# Patient Record
Sex: Female | Born: 1965 | Race: Black or African American | Hispanic: No | Marital: Single | Smoking: Never smoker
Health system: Southern US, Community
[De-identification: ages and names within clinical notes are randomized; demographics above are authoritative.]

## PROBLEM LIST (undated history)

## (undated) HISTORY — PX: OTHER SURGICAL HISTORY: SHX169

---

## 1997-12-14 ENCOUNTER — Encounter: Admission: RE | Admit: 1997-12-14 | Discharge: 1997-12-14 | Payer: Self-pay | Admitting: *Deleted

## 1998-05-30 ENCOUNTER — Ambulatory Visit (HOSPITAL_COMMUNITY): Admission: RE | Admit: 1998-05-30 | Discharge: 1998-05-30 | Payer: Self-pay | Admitting: *Deleted

## 2011-10-19 ENCOUNTER — Ambulatory Visit (INDEPENDENT_AMBULATORY_CARE_PROVIDER_SITE_OTHER): Payer: BC Managed Care – PPO | Admitting: Obstetrics and Gynecology

## 2011-10-19 ENCOUNTER — Encounter: Payer: Self-pay | Admitting: Obstetrics and Gynecology

## 2011-10-19 VITALS — BP 122/80 | Ht 70.0 in | Wt 217.0 lb

## 2011-10-19 DIAGNOSIS — N926 Irregular menstruation, unspecified: Secondary | ICD-10-CM

## 2011-10-19 DIAGNOSIS — Z124 Encounter for screening for malignant neoplasm of cervix: Secondary | ICD-10-CM

## 2011-10-19 LAB — POCT URINE PREGNANCY: Preg Test, Ur: NEGATIVE

## 2011-10-19 NOTE — Progress Notes (Signed)
Last Pap: at age 46  WNL: Yes Regular Periods:yes Contraception: n/a  Monthly Breast exam:yes Tetanus<66yrs:yes Nl.Bladder Function:yes Daily BMs:yes Healthy Diet:no Calcium:yes Mammogram:no Date of Mammogram: n/a Exercise:yes Have often Exercise: everyday Seatbelt: yes Abuse at home: no Stressful work:no Sigmoid-colonoscopy: n/a Bone Density: No PCP: Dr.Pange Change in PMH: no chgane Change in FMH:no change BP 122/80  Ht 5\' 10"  (1.778 m)  Wt 217 lb (98.431 kg)  BMI 31.14 kg/m2  LMP 09/10/2011 Pt without complaints Physical Examination: General appearance - alert, well appearing, and in no distress Mental status - normal mood, behavior, speech, dress, motor activity, and thought processes Neck - supple, no significant adenopathy, thyroid exam: thyroid is normal in size without nodules or tenderness Chest - clear to auscultation, no wheezes, rales or rhonchi, symmetric air entry Heart - normal rate and regular rhythm Abdomen - soft, nontender, nondistended, no masses or organomegaly Breasts - breasts appear normal, no suspicious masses, no skin or nipple changes or axillary nodes Pelvic - normal external genitalia, vulva, vagina, cervix, uterus and adnexa Rectal - normal rectal, no masses, rectal exam not indicated Back exam - full range of motion, no tenderness, palpable spasm or pain on motion Neurological - alert, oriented, normal speech, no focal findings or movement disorder noted Musculoskeletal - no joint tenderness, deformity or swelling Extremities - no edema, redness or tenderness in the calves or thighs Skin - normal coloration and turgor, no rashes, no suspicious skin lesions noted Routine exam Pap sent yes Mammogram due yes and scheduled pt is abstinant and declined contraception.   RT 1 yr

## 2011-10-22 LAB — PAP IG W/ RFLX HPV ASCU

## 2011-11-09 ENCOUNTER — Telehealth: Payer: Self-pay

## 2011-11-09 NOTE — Telephone Encounter (Signed)
spokew ith pt rgd msg advised pt of pap results pt voice understanding

## 2013-01-02 ENCOUNTER — Other Ambulatory Visit: Payer: Self-pay | Admitting: Obstetrics and Gynecology

## 2013-01-02 DIAGNOSIS — Z1231 Encounter for screening mammogram for malignant neoplasm of breast: Secondary | ICD-10-CM

## 2013-02-02 ENCOUNTER — Ambulatory Visit: Payer: Self-pay

## 2013-02-02 ENCOUNTER — Ambulatory Visit
Admission: RE | Admit: 2013-02-02 | Discharge: 2013-02-02 | Disposition: A | Discharge status: Home / Self Care | Payer: BC Managed Care – PPO | Source: Ambulatory Visit | Attending: Obstetrics and Gynecology | Admitting: Obstetrics and Gynecology

## 2013-02-02 DIAGNOSIS — Z1231 Encounter for screening mammogram for malignant neoplasm of breast: Secondary | ICD-10-CM

## 2014-03-05 HISTORY — PX: FOOT SURGERY: SHX648

## 2014-07-07 ENCOUNTER — Ambulatory Visit (INDEPENDENT_AMBULATORY_CARE_PROVIDER_SITE_OTHER): Payer: BC Managed Care – PPO | Admitting: Podiatry

## 2014-07-07 ENCOUNTER — Ambulatory Visit (INDEPENDENT_AMBULATORY_CARE_PROVIDER_SITE_OTHER): Payer: BC Managed Care – PPO

## 2014-07-07 VITALS — BP 113/73 | HR 60 | Resp 15

## 2014-07-07 DIAGNOSIS — M2012 Hallux valgus (acquired), left foot: Secondary | ICD-10-CM

## 2014-07-07 DIAGNOSIS — L84 Corns and callosities: Secondary | ICD-10-CM

## 2014-07-07 DIAGNOSIS — M21612 Bunion of left foot: Secondary | ICD-10-CM

## 2014-07-07 DIAGNOSIS — M2042 Other hammer toe(s) (acquired), left foot: Secondary | ICD-10-CM

## 2014-07-07 DIAGNOSIS — M779 Enthesopathy, unspecified: Secondary | ICD-10-CM

## 2014-07-07 NOTE — Progress Notes (Signed)
   Subjective:    Patient ID: Tina [name], female    DOB: 06/02/1965, 49 y.o.   MRN: [id number]  HPI  Pt presents with left toe 2nd met hammertoe deformity, aches and causes pain when wearing enclosed shoes  Review of Systems  All other systems reviewed and are negative.      Objective:   Physical Exam        Assessment & Plan:

## 2014-07-07 NOTE — Progress Notes (Signed)
Subjective:     Patient ID: Tina [name], female   DOB: 04/22/1965, 49 y.o.   MRN: [id number]  HPI patient presents stating I'm getting pain in my second toe left underneath my second metatarsal left my fifth toe left and my bunion is bothering me when I wear shoe gear left. States that she has significant family history with her grandmother having this and it's gotten worse over the last year   Review of Systems  All other systems reviewed and are negative.      Objective:   Physical Exam  Constitutional: She is oriented to person, place, and time.  Cardiovascular: Intact distal pulses.   Musculoskeletal: Normal range of motion.  Neurological: She is oriented to person, place, and time.  Skin: Skin is warm.  Nursing note and vitals reviewed.  neurovascular status intact with muscle strength adequate range of motion subtalar midtarsal joint within normal limits. Patient's noted to have structural bunion deformity with deviation of the left toe against the second toe with keratotic lesion between the hallux and second toe left and quite a bit of keratotic lesion subsecond metatarsal left along with fifth toe left with deviation and rotation of the toe noted. 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 At this time we discussed conservative and surgical options that could be considered for this particular problem that she is experiencing and we reviewed all of these options. She would like to have her foot fixed due to the long-standing  nature and the fact that her mother and grandmother have had issues with her feet and she would like to have some done before becomes worse for her I've recommended Austin-type osteotomy along with possible Akin osteotomy digital fusion with shortening digit 2 left arthroplasty digits 5 left. Explain this to her at great length and she is scheduled for August for procedure and will be seen back for consult. Today I debrided all lesions and I also scanned for custom orthotics to try to take some of the plantar pressure off her feet

## 2014-07-27 ENCOUNTER — Ambulatory Visit: Payer: BC Managed Care – PPO | Admitting: *Deleted

## 2014-07-27 DIAGNOSIS — M779 Enthesopathy, unspecified: Secondary | ICD-10-CM

## 2014-07-27 NOTE — Patient Instructions (Signed)

## 2014-07-27 NOTE — Progress Notes (Signed)
Patient ID: Tina [name], female   DOB: Apr 10, 1965, 49 y.o.   MRN: [id number] PICKING UP INSERTS

## 2014-10-06 ENCOUNTER — Telehealth: Payer: Self-pay | Admitting: *Deleted

## 2014-10-06 ENCOUNTER — Ambulatory Visit (INDEPENDENT_AMBULATORY_CARE_PROVIDER_SITE_OTHER): Payer: BC Managed Care – PPO | Admitting: Podiatry

## 2014-10-06 ENCOUNTER — Encounter: Payer: Self-pay | Admitting: Podiatry

## 2014-10-06 DIAGNOSIS — M2012 Hallux valgus (acquired), left foot: Secondary | ICD-10-CM | POA: Diagnosis not present

## 2014-10-06 DIAGNOSIS — Q667 Congenital pes cavus: Secondary | ICD-10-CM | POA: Diagnosis not present

## 2014-10-06 DIAGNOSIS — M779 Enthesopathy, unspecified: Secondary | ICD-10-CM

## 2014-10-06 DIAGNOSIS — M2042 Other hammer toe(s) (acquired), left foot: Secondary | ICD-10-CM

## 2014-10-06 DIAGNOSIS — M21612 Bunion of left foot: Secondary | ICD-10-CM

## 2014-10-06 DIAGNOSIS — M216X9 Other acquired deformities of unspecified foot: Secondary | ICD-10-CM

## 2014-10-06 NOTE — Telephone Encounter (Signed)
I called the surgical center per Dr. Charlsie Merles and added a possible Metatarsal Osteotomy 2nd left to surgery scheduled for 11/02/2014.

## 2014-10-06 NOTE — Progress Notes (Signed)
Subjective:     Patient ID: Tina [name], female   DOB: 1965/10/05, 49 y.o.   MRN: [id number]  HPI patient presents stating I have a structural bunion deformity left and elevated second toe that hurts me the bone underneath hurts and my fifth toe. She has tried trimming she's tried padding she's tried wider shoes without relief of symptoms and has had this problem getting worse for the last several years   Review of Systems     Objective:   Physical Exam Neurovascular status intact muscle strength adequate range of motion within normal limits with structural bunion deformity left with redness and pain on the first metatarsal and deviation of the big toe against the second toe left with rigid elevation of the second toe left with pain upon the top of the toe and plantarflexed second metatarsal left with keratotic lesion and keratotic lesion fifth toe left foot that's very painful when pressed area x-rays reveal elevation of the intermetatarsal angle of approximately 15 with rigid control and rigid nature of the second toe left foot and plantarflexed second metatarsal and rotated fifth toe    Assessment:     HAV deformity left with hallux interphalangeus deformity and structural hammertoe deformity second left with plantarflexed metatarsal second left hammertoe deformity fifth left noted    Plan:     H&P and all conditions reviewed with patient. At this point due to long-standing nature I did recommend structural correction and I recommended Austin-type osteotomy left possible Akin osteotomy left to straighten the big toe along with digital fusion digit 2 left and possible elevation of the second metatarsal left and on how it feels after the toe is stabilized. Also arthroplasty fifth toe left. At this time allowed patient to read consent form reviewing all procedures and going over alternative treatments and complications. Patient reads through this and we discussed and she signed consent form  reviewing procedures and is scheduled for her outpatient surgery. She is given her boot for postoperative usage and we'll get used to it prior to surgery and will be seen back and is encouraged to call she should have any questions prior to procedure

## 2014-10-06 NOTE — Patient Instructions (Signed)
Pre-Operative Instructions  Congratulations, you have decided to take an important step to improving your quality of life.  You can be assured that the doctors of [facility] will be with you every step of the way.  1. Plan to be at the surgery center/hospital at least 1 (one) hour prior to your scheduled time unless otherwise directed by the surgical center/hospital staff.  You must have a responsible adult accompany you, remain during the surgery and drive you home.  Make sure you have directions to the surgical center/hospital and know how to get there on time. 2. For hospital based surgery you will need to obtain a history and physical form from your family physician within 1 month prior to the date of surgery- we will give you a form for you primary physician.  3. We make every effort to accommodate the date you request for surgery.  There are however, times where surgery dates or times have to be moved.  We will contact you as soon as possible if a change in schedule is required.   4. No Aspirin/Ibuprofen for one week before surgery.  If you are on aspirin, any non-steroidal anti-inflammatory medications (Mobic, Aleve, Ibuprofen) you should stop taking it 7 days prior to your surgery.  You make take Tylenol  For pain prior to surgery.  5. Medications- If you are taking daily heart and blood pressure medications, seizure, reflux, allergy, asthma, anxiety, pain or diabetes medications, make sure the surgery center/hospital is aware before the day of surgery so they may notify you which medications to take or avoid the day of surgery. 6. No food or drink after midnight the night before surgery unless directed otherwise by surgical center/hospital staff. 7. No alcoholic beverages 24 hours prior to surgery.  No smoking 24 hours prior to or 24 hours after surgery. 8. Wear loose pants or shorts- loose enough to fit over bandages, boots, and casts. 9. No slip on shoes, sneakers are best. 10. Bring  your boot with you to the surgery center/hospital.  Also bring crutches or a walker if your physician has prescribed it for you.  If you do not have this equipment, it will be provided for you after surgery. 11. If you have not been contracted by the surgery center/hospital by the day before your surgery, call to confirm the date and time of your surgery. 12. Leave-time from work may vary depending on the type of surgery you have.  Appropriate arrangements should be made prior to surgery with your employer. 13. Prescriptions will be provided immediately following surgery by your doctor.  Have these filled as soon as possible after surgery and take the medication as directed. 14. Remove nail polish on the operative foot. 15. Wash the night before surgery.  The night before surgery wash the foot and leg well with the antibacterial soap provided and water paying special attention to beneath the toenails and in between the toes.  Rinse thoroughly with water and dry well with a towel.  Perform this wash unless told not to do so by your physician.  Enclosed: 1 Ice pack (please put in freezer the night before surgery)   1 Hibiclens skin cleaner   Pre-op Instructions  If you have any questions regarding the instructions, do not hesitate to call our office.  [place]: [address] , [place] [address] [phone]  [place]: [address] [place], [place] [address] [phone]  [place]: [address]  [place], [place] [address] [phone]  Dr. Richard   Tuchman DPM, Dr. Norman Regal DPM Dr. Richard Sikora DPM, Dr. M. Todd Hyatt DPM, Dr. Kathryn Egerton DPM 

## 2014-10-11 DIAGNOSIS — M79673 Pain in unspecified foot: Secondary | ICD-10-CM

## 2014-11-02 DIAGNOSIS — M21542 Acquired clubfoot, left foot: Secondary | ICD-10-CM | POA: Diagnosis not present

## 2014-11-02 DIAGNOSIS — M2042 Other hammer toe(s) (acquired), left foot: Secondary | ICD-10-CM | POA: Diagnosis not present

## 2014-11-02 DIAGNOSIS — M2012 Hallux valgus (acquired), left foot: Secondary | ICD-10-CM | POA: Diagnosis not present

## 2014-11-10 ENCOUNTER — Encounter: Payer: Self-pay | Admitting: *Deleted

## 2014-11-10 NOTE — Progress Notes (Signed)
Surgery performed on 11/02/2014 at [facility]; Aiken Osteotomy, Austin Bunionectomy, Metatarsal Osteotomy 2nd left and Hammer Toe Repair 2nd digit with pin and 5th digit left foot.  Prescription written for Percocet 10/325 mg.

## 2014-11-11 ENCOUNTER — Ambulatory Visit (INDEPENDENT_AMBULATORY_CARE_PROVIDER_SITE_OTHER): Payer: BC Managed Care – PPO | Admitting: Podiatry

## 2014-11-11 ENCOUNTER — Ambulatory Visit (INDEPENDENT_AMBULATORY_CARE_PROVIDER_SITE_OTHER): Payer: BC Managed Care – PPO

## 2014-11-11 ENCOUNTER — Encounter: Payer: Self-pay | Admitting: Podiatry

## 2014-11-11 VITALS — BP 110/75 | HR 65 | Temp 98.2°F | Resp 16

## 2014-11-11 DIAGNOSIS — Z9889 Other specified postprocedural states: Secondary | ICD-10-CM

## 2014-11-11 DIAGNOSIS — M2012 Hallux valgus (acquired), left foot: Secondary | ICD-10-CM

## 2014-11-11 DIAGNOSIS — M2042 Other hammer toe(s) (acquired), left foot: Secondary | ICD-10-CM

## 2014-11-11 DIAGNOSIS — M21612 Bunion of left foot: Secondary | ICD-10-CM

## 2014-11-11 NOTE — Progress Notes (Signed)
Subjective:     Patient ID: Tina Tina [name], female   DOB: 25-Aug-1965, 49 y.o.   MRN: [id number]  HPI patient presents stating she's doing really well with mild discomfort and swelling if she's on her foot to much but she's not having significant pain   Review of Systems     Objective:   Physical Exam Neurovascular status intact negative Homans sign noted with patient having well coapted incision sites left first metatarsal left hallux second digit with pin intact second toe and wound edges well coapted fifth toe. Range of motion of the first MPJ is adequate and structural alignment looks good with mild elevation of the second toe    Assessment:     Doing well Tina [name] forefoot reconstruction left    Plan:     H&P and x-rays of this foot reviewed. I reapplied sterile dressing lowering the second toe and advised on continued elevation compression and reappoint 2 weeks for suture removal or earlier if any issues should occur

## 2014-11-12 ENCOUNTER — Encounter: Payer: Self-pay | Admitting: Podiatry

## 2014-11-22 DIAGNOSIS — M2012 Hallux valgus (acquired), left foot: Secondary | ICD-10-CM

## 2014-11-26 ENCOUNTER — Encounter: Payer: Self-pay | Admitting: Podiatry

## 2014-11-26 ENCOUNTER — Ambulatory Visit (INDEPENDENT_AMBULATORY_CARE_PROVIDER_SITE_OTHER): Payer: BC Managed Care – PPO | Admitting: Podiatry

## 2014-11-26 ENCOUNTER — Telehealth: Payer: Self-pay | Admitting: *Deleted

## 2014-11-26 ENCOUNTER — Ambulatory Visit (INDEPENDENT_AMBULATORY_CARE_PROVIDER_SITE_OTHER): Payer: BC Managed Care – PPO

## 2014-11-26 DIAGNOSIS — Z9889 Other specified postprocedural states: Secondary | ICD-10-CM

## 2014-11-26 DIAGNOSIS — M2012 Hallux valgus (acquired), left foot: Secondary | ICD-10-CM | POA: Diagnosis not present

## 2014-11-26 DIAGNOSIS — M21612 Bunion of left foot: Secondary | ICD-10-CM

## 2014-11-26 DIAGNOSIS — M2042 Other hammer toe(s) (acquired), left foot: Secondary | ICD-10-CM

## 2014-11-26 NOTE — Telephone Encounter (Signed)
"  I'm calling in regards to her disability.  I want to confirm that she had surgery on 11/02/2014."  Yes, patient had surgery on 11/02/2014.  "Okay, that's all I needed."

## 2014-11-28 NOTE — Progress Notes (Signed)
Subjective:     Patient ID: Tina [name], female   DOB: [birth year], 49 y.o.   MRN: [id number]  HPI patient states I'm doing pretty well but still having some discomfort and swelling when I'm on it too much and the second toe is slightly up over where it was   Review of Systems     Objective:   Physical Exam Neurovascular status intact muscle strength adequate range of motion within normal limits with patient noted to have good incision sites that are well coapted with stitches intact of the lesser digits and patient's noted to have mild elevation of the second digit with pin in place    Assessment:     Recovering well from foot surgery left with good alignment noted and pin in place second toe with mild elevation of the second toe and all wound edges healing well    Plan:     Stitches are removed and instructed on range of motion for the first MPJ and dispensed a above ankle brace to control swelling and also to lower the second toe. With a digital splint which was applied. Patient will be seen back to recheck

## 2014-12-10 ENCOUNTER — Encounter: Payer: Self-pay | Admitting: Podiatry

## 2014-12-10 ENCOUNTER — Ambulatory Visit (INDEPENDENT_AMBULATORY_CARE_PROVIDER_SITE_OTHER): Payer: BC Managed Care – PPO | Admitting: Podiatry

## 2014-12-10 ENCOUNTER — Ambulatory Visit (INDEPENDENT_AMBULATORY_CARE_PROVIDER_SITE_OTHER): Payer: BC Managed Care – PPO

## 2014-12-10 VITALS — BP 129/82 | HR 64 | Resp 16

## 2014-12-10 DIAGNOSIS — Z9889 Other specified postprocedural states: Secondary | ICD-10-CM

## 2014-12-10 DIAGNOSIS — M21612 Bunion of left foot: Secondary | ICD-10-CM

## 2014-12-10 DIAGNOSIS — R600 Localized edema: Secondary | ICD-10-CM

## 2014-12-10 DIAGNOSIS — M2042 Other hammer toe(s) (acquired), left foot: Secondary | ICD-10-CM

## 2014-12-11 NOTE — Progress Notes (Signed)
Subjective:     Patient ID: Tina [name], female   DOB: 02/04/1966, 49 y.o.   MRN: [id number]  HPI patient states I'm doing pretty well with my digit in good alignment and wound edges well coapted. I still have mild swelling   Review of Systems     Objective:   Physical Exam Neurovascular status intact muscle strength adequate range of motion within normal limits with digit in good alignment pin in place structural bunion correction good in other digits doing well with mild to moderate forefoot edema noted    Assessment:     Moderate increase in edema over what I would expect but good structural alignment noted and negative Homans sign    Plan:     H&P and x-rays reviewed with patient. Pin removed second toe and sterile dressing applied and at this time I applied Unna boot Ace wrap to the left foot and advised on trying to leave it on for 4 days and take it off earlier if any digital discoloration or pain were to occur. Patient will be rechecked again in 3 weeks we'll continue with elevation compression until that time

## 2014-12-21 ENCOUNTER — Telehealth: Payer: Self-pay | Admitting: *Deleted

## 2014-12-30 NOTE — Telephone Encounter (Signed)
Entered in error

## 2014-12-31 ENCOUNTER — Other Ambulatory Visit: Payer: BC Managed Care – PPO

## 2014-12-31 ENCOUNTER — Ambulatory Visit (INDEPENDENT_AMBULATORY_CARE_PROVIDER_SITE_OTHER): Payer: BC Managed Care – PPO | Admitting: Podiatry

## 2014-12-31 ENCOUNTER — Ambulatory Visit (INDEPENDENT_AMBULATORY_CARE_PROVIDER_SITE_OTHER): Payer: BC Managed Care – PPO

## 2014-12-31 DIAGNOSIS — M21612 Bunion of left foot: Secondary | ICD-10-CM

## 2014-12-31 DIAGNOSIS — Z9889 Other specified postprocedural states: Secondary | ICD-10-CM | POA: Diagnosis not present

## 2014-12-31 DIAGNOSIS — M2042 Other hammer toe(s) (acquired), left foot: Secondary | ICD-10-CM

## 2015-01-02 NOTE — Progress Notes (Signed)
Subjective:     Patient ID: Tina [name], female   DOB: May 28, 1965, 49 y.o.   MRN: [id number]  HPI patient states I'm doing pretty good with my foot with still some swelling noted and I can walk but I do get pain if I walk too much   Review of Systems     Objective:   Physical Exam Neurovascular status unchanged with patient having good digital function first MPJ and good digital position second and third toes with mild elevation of the second toe with wound edges well coapted and excellent range of motion    Assessment:     Doing well from structural foot correction left    Plan:     Reviewed condition and advised on continued lowering of the second toe and explained how to do it continued compression elevation and reviewed x-rays. Reappoint to recheck an approximate 4 weeks or earlier if any issues should occur

## 2015-01-03 ENCOUNTER — Telehealth: Payer: Self-pay | Admitting: *Deleted

## 2015-01-03 NOTE — Telephone Encounter (Addendum)
Pt left name, DOB and phone number only.  Left message informing pt I would call again 01/04/2015.  Pt called 551pm on 01/03/2015, states she is returning my call.  Left message informing pt if she was having severe pain with her foot, it would benefit her and her busy class schedule to make an appt.  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  Dr. Charlsie Merlesegal states pt may be out of work until reevaluated 02/11/2015. I told pt I would have the note ready for pick up, but if she needed paperwork filled out for disability she would need to bring that for Algis GreenhouseJanet Albert to complete. Pt states she will pick up the note 01/05/2015.

## 2015-01-04 ENCOUNTER — Encounter: Payer: Self-pay | Admitting: *Deleted

## 2015-02-11 ENCOUNTER — Ambulatory Visit (INDEPENDENT_AMBULATORY_CARE_PROVIDER_SITE_OTHER): Payer: BC Managed Care – PPO | Admitting: Podiatry

## 2015-02-11 ENCOUNTER — Ambulatory Visit (INDEPENDENT_AMBULATORY_CARE_PROVIDER_SITE_OTHER): Payer: BC Managed Care – PPO

## 2015-02-11 ENCOUNTER — Encounter: Payer: Self-pay | Admitting: Podiatry

## 2015-02-11 VITALS — BP 129/87 | HR 70 | Resp 16

## 2015-02-11 DIAGNOSIS — M2042 Other hammer toe(s) (acquired), left foot: Secondary | ICD-10-CM

## 2015-02-11 DIAGNOSIS — Z9889 Other specified postprocedural states: Secondary | ICD-10-CM | POA: Diagnosis not present

## 2015-02-11 DIAGNOSIS — M21612 Bunion of left foot: Secondary | ICD-10-CM

## 2015-02-13 NOTE — Progress Notes (Signed)
Subjective:     Patient ID: Tina [name], female   DOB: Aug 08, 1965, 49 y.o.   MRN: [id number]  HPI patient states I'm doing fine with my left foot but I do get occasional sharp pains that I wanted to make sure okay and I'm not able to wear all shoe gear   Review of Systems     Objective:   Physical Exam Neurovascular status intact muscle strength adequate with well-healed surgical sites left with mild edema still noted    Assessment:     Doing well with mild edema persistent but within normal range    Plan:     X-rays reviewed and discussed good healing is occurring and that ultimately all shoe gear should be comfortable but she needs to be patient. Continue range of motion exercises and reappoint as needed

## 2015-03-10 ENCOUNTER — Telehealth: Payer: Self-pay | Admitting: *Deleted

## 2015-03-10 NOTE — Telephone Encounter (Addendum)
Pt states she needs a note that she will be out of work until the end of January 2017, because her toe is still numb and swells.  Dr. Charlsie Merlesegal states pt is to be out of work until end of January 2017, note written.  Informed pt of the out of work note and she said she will pick it up today.

## 2015-03-14 ENCOUNTER — Encounter: Payer: Self-pay | Admitting: *Deleted

## 2015-03-14 NOTE — Telephone Encounter (Signed)
fine

## 2015-03-15 ENCOUNTER — Telehealth: Payer: Self-pay | Admitting: *Deleted

## 2015-03-15 NOTE — Telephone Encounter (Signed)
Pt presented to pick up extended leave letter.

## 2015-04-14 ENCOUNTER — Ambulatory Visit: Payer: BC Managed Care – PPO | Admitting: Podiatry

## 2015-04-21 ENCOUNTER — Encounter: Payer: Self-pay | Admitting: Podiatry

## 2015-04-21 ENCOUNTER — Ambulatory Visit (INDEPENDENT_AMBULATORY_CARE_PROVIDER_SITE_OTHER): Payer: BC Managed Care – PPO

## 2015-04-21 ENCOUNTER — Ambulatory Visit (INDEPENDENT_AMBULATORY_CARE_PROVIDER_SITE_OTHER): Payer: BC Managed Care – PPO | Admitting: Podiatry

## 2015-04-21 VITALS — BP 104/68 | HR 63 | Resp 16

## 2015-04-21 DIAGNOSIS — M2042 Other hammer toe(s) (acquired), left foot: Secondary | ICD-10-CM

## 2015-04-21 DIAGNOSIS — Z9889 Other specified postprocedural states: Secondary | ICD-10-CM

## 2015-04-21 DIAGNOSIS — M21612 Bunion of left foot: Secondary | ICD-10-CM

## 2015-04-21 NOTE — Progress Notes (Signed)
Subjective:     Patient ID: Tina [name], female   DOB: [birth year], 50 y.o.   MRN: [id number]  HPI patient presents concerned because she gets some pain in her forefoot left after prolonged activity   Review of Systems     Objective:   Physical Exam Neurovascular status intact muscle strength adequate with patient having mild discomfort in the second MPJ and around the big toe joint left but minimal edema noted    Assessment:     May be some form of inflammatory capsulitis localized left    Plan:     H&P x-rays reviewed and explained can take upwards of 1 year for all pain to go away and at this time I am not real concerned that this will be something that will be a long-term issue. Patient will be seen back as symptoms dictate  X-ray report left indicates pins screws in place and no indication of significant elevation second toe left foot

## 2016-06-20 ENCOUNTER — Ambulatory Visit (INDEPENDENT_AMBULATORY_CARE_PROVIDER_SITE_OTHER): Payer: BC Managed Care – PPO

## 2016-06-20 ENCOUNTER — Ambulatory Visit (INDEPENDENT_AMBULATORY_CARE_PROVIDER_SITE_OTHER): Payer: BC Managed Care – PPO | Admitting: Physician Assistant

## 2016-06-20 VITALS — BP 138/86 | HR 65 | Temp 97.7°F | Ht 69.29 in | Wt 268.6 lb

## 2016-06-20 DIAGNOSIS — M79645 Pain in left finger(s): Secondary | ICD-10-CM

## 2016-06-20 DIAGNOSIS — S63255A Unspecified dislocation of left ring finger, initial encounter: Secondary | ICD-10-CM | POA: Diagnosis not present

## 2016-06-20 NOTE — Progress Notes (Signed)
Patient ID: Tina [name], female    DOB: 07-24-1965, 51 y.o.   MRN: [id number]  PCP: Juline Patch, MD  Chief Complaint  Patient presents with  . Hand Injury    left hand- X 1 day    Subjective:   Presents for evaluation of LEFT ring finger injury.  Last night she was donning her socks, standing on one foot (practicing her core strengthening and balancing) when she slipped on the hardwood flooring and fell on an outstretched LEFT hand. She has pain and reduced motion of the DIP of the LEFT ring finger. No numbness or tingling, no loss of sensation.  She did not feel dizzy prior to the fall and did not injure any other part of her body-did not hit her head.  She went on to work, but her supervisor "made" her come in for evaluation. She intends to return to work after this visit.   Review of Systems As above.    There are no active problems to display for this patient.   No Known Allergies  Prior to Admission medications   Not on File     Past Medical, Surgical Family and Social History reviewed and updated.        Objective:  Physical Exam  Constitutional: She is oriented to person, place, and time. She appears well-developed and well-nourished. She is active and cooperative. No distress.  BP 138/86 (BP Location: Right Arm, Patient Position: Sitting, Cuff Size: Large)   Pulse 65   Temp 97.7 F (36.5 C) (Oral)   Ht 5' 9.29" (1.76 m)   Wt 268 lb 9.6 oz (121.8 kg)   LMP 09/20/2015 (Approximate)   SpO2 100%   BMI 39.33 kg/m    Eyes: Conjunctivae are normal.  Pulmonary/Chest: Effort normal.  Musculoskeletal:       Left wrist: Normal.       Left hand: She exhibits decreased range of motion (Ring finger PIP), tenderness and deformity. She exhibits no bony tenderness, normal two-point discrimination, normal capillary refill, no laceration and no swelling. Normal sensation noted. Normal strength noted.       Hands: Neurological: She is alert and oriented to  person, place, and time.  Psychiatric: She has a normal mood and affect. Her speech is normal and behavior is normal.   Dg Finger Ring Left  Result Date: 06/20/2016 CLINICAL DATA:  Fall on outstretched hand dislocated left ring finger EXAM: LEFT RING FINGER 2+V COMPARISON:  None. FINDINGS: One bone width dorsal dislocation of the middle phalanx of the ring finger with respect to the proximal phalanx, and with 5 mm of bony overlap shown on the lateral projection. No well-defined fracture. IMPRESSION: 1. Dislocated PIP joint of the left fourth finger. Electronically Signed   By: Gaylyn Rong M.D.   On: 06/20/2016 11:19    PROCEDURE: With permission, anesthesia provided by metacarpal block using 4 cc of lidocaine 2% plain. Reduction achieved. Patient tolerated well without difficulty.   Dg Finger Ring Left  Result Date: 06/20/2016 CLINICAL DATA:  Post reduction of fourth digit proximal interphalangeal joint, initial encounter. EXAM: LEFT RING FINGER 2+V COMPARISON:  06/20/2016. FINDINGS: Interval restoration of anatomic alignment of the fourth proximal interphalangeal joint. No associated fracture. Overlying soft tissue swelling. IMPRESSION: Interval restoration of anatomic alignment of the fourth proximal interphalangeal joint. No associated fracture. Electronically Signed   By: Leanna Battles M.D.   On: 06/20/2016 12:04        Assessment & Plan:   Problem List  Items Addressed This Visit    None    Visit Diagnoses    Closed dislocation of left ring finger    -  Primary   Splint applied with joint in extension, DIP free to flex/extend, MCP at 90 degrees. NSAIDS PRN. Plan return to ROM quickly at follow-up.   Relevant Orders   DG Finger Ring Left (Completed)   DG Finger Ring Left (Completed)       Return in 3 days (on 06/23/2016) for re-evaluation of finger dislocation.   Fernande Bras, PA-C [facility] at [facility]

## 2016-06-20 NOTE — Progress Notes (Signed)
THIS NOTE IS USED FOR EDUCATIONAL PURPOSES ONLY!!!   Name: Tina [name]  DOB: 1965/03/18  Age: 51 y.o. Sex: female  CC:  Chief Complaint  Patient presents with  . Hand Injury    left hand- X 1 day    PCP: PANG,RICHARD, MD  HPI: Paitent reports today for c/o left ring finger pain from a fall this morning.   Patient reports that she was putting on her socks this morning on a hardwood floor and is working on her balance while putting on her socks and slipped on her floor and FOOSHed on her left hand. She reports she is trying to work on her balance while she dresses and slipped. She denies taking anything for the pain. She reports that after she went to work and has come in now for evaluation. Denies numbness/tingling. Normal sensation. Pain at the DIP of the left ring finger. She denies LOC or dizziness/lightheadedness prior to the fall. She did not hit her head.   ROS:  Constitutional: Negative for activity change, appetite change, fatigue and unexpected weight change.  HENT: Negative for congestion, dental problem, ear pain, hearing loss, mouth sores, postnasal drip, rhinorrhea, sneezing, sore throat, tinnitus and trouble swallowing.   Eyes: Negative for photophobia, pain, redness and visual disturbance.  Respiratory: Negative for cough, chest tightness and shortness of breath.   Cardiovascular: Negative for chest pain, palpitations and leg swelling.  Gastrointestinal: Negative for abdominal pain, blood in stool, constipation, diarrhea, nausea and vomiting.  Genitourinary: Negative for dysuria, frequency, hematuria and urgency.  Musculoskeletal: Negative for arthralgias, gait problem, myalgias and neck stiffness.  Skin: Negative for rash.  Neurological: Negative for dizziness, speech difficulty, weakness, light-headedness, numbness and headaches.  Hematological: Negative for adenopathy.  Psychiatric/Behavioral: Negative for confusion and sleep disturbance. The patient is not  nervous/anxious.    PMH: There are no active problems to display for this patient.   Allergies: No Known Allergies  Medications:  No current outpatient prescriptions on file prior to visit.   No current facility-administered medications on file prior to visit.     PE:  GS: WDWN female sitting on exam table in NAD.  Vitals: BP 138/86 (BP Location: Right Arm, Patient Position: Sitting, Cuff Size: Large)   Pulse 65   Temp 97.7 F (36.5 C) (Oral)   Ht 5' 9.29" (1.76 m)   Wt 268 lb 9.6 oz (121.8 kg)   LMP 09/20/2015 (Approximate)   SpO2 100%   BMI 39.33 kg/m  HEENT: Normocephalic, atruamatic. PEARRL. No cervical lymphadenopathy. No thyroid nodules, normal size, and equal bilaterally.  Cardiovascular: RRR. No S3 or S4. No murmurs, rubs, or gallops. Pulses 2+ and equal bilateral in the upper and lower extremities. No pitting edema. No varicosities, clubbing, or cyanosis.  Pulm: CTA bilaterally. No expiratory muscle use while breathing.  GI: +BS. NTND. No rigidity or guarding. No rebound tenderness.  MSK: Deformed left ring finger. TTP of the proximal carpel bone of the left ring finger. Moderate edema around the PIP joint of the 4th digit. Normal sensation. Decreased ROM of 4th digit of left hand. Capillary refill <2 sec.  Neuro: CN 2-12 grossly intact.  Psych: A&O x 4. Mood and affect appropriate for situation.  Skin: Warm and dry. No rashes or excoriations on exposed skin.   Imaging:  Initial image: dislocated PIP joint of the left fourth finger.  Re-image after relocation: Internal restoration of anatomic alignment of fourth proximal interphalangeal joint. No associated fracture.   A&P:  Closed  dislocation of left ring finger - Nerve block placed on left ring finger. Relocated with traction. Re-imaging confirms placement. Splint applied. RTC in 3 days.  Plan: DG Finger Ring Left, DG Finger Ring Left       Respectfully,  Camillia Herter, PA-S2

## 2016-06-20 NOTE — Patient Instructions (Signed)
     IF you received an x-ray today, you will receive an invoice from [facility]. Please contact [facility] at [phone] with questions or concerns regarding your invoice.   IF you received labwork today, you will receive an invoice from [facility]. Please contact [facility] at [phone] with questions or concerns regarding your invoice.   Our billing staff will not be able to assist you with questions regarding bills from these companies.  You will be contacted with the lab results as soon as they are available. The fastest way to get your results is to activate your My Chart account. Instructions are located on the last page of this paperwork. If you have not heard from us regarding the results in 2 weeks, please contact this office.     

## 2016-06-23 ENCOUNTER — Ambulatory Visit (INDEPENDENT_AMBULATORY_CARE_PROVIDER_SITE_OTHER): Payer: BC Managed Care – PPO | Admitting: Physician Assistant

## 2016-06-23 VITALS — BP 114/74 | HR 60 | Temp 97.8°F | Resp 17 | Ht 69.29 in | Wt 266.1 lb

## 2016-06-23 DIAGNOSIS — S63255D Unspecified dislocation of left ring finger, subsequent encounter: Secondary | ICD-10-CM

## 2016-06-23 NOTE — Patient Instructions (Addendum)
Use ice buckets for the finger 20 minutes on 20 minutes off. Move as tolerated. Use stretches and resistive motion to build strength. Buddy tape as needed for support when you're going to use the finger.     IF you received an x-ray today, you will receive an invoice from [facility]. Please contact [facility] at [phone] with questions or concerns regarding your invoice.   IF you received labwork today, you will receive an invoice from [facility]. Please contact [facility] at [phone] with questions or concerns regarding your invoice.   Our billing staff will not be able to assist you with questions regarding bills from these companies.  You will be contacted with the lab results as soon as they are available. The fastest way to get your results is to activate your My Chart account. Instructions are located on the last page of this paperwork. If you have not heard from [place] regarding the results in 2 weeks, please contact this office.

## 2016-06-23 NOTE — Progress Notes (Signed)
THIS NOTE IS USED FOR EDUCATIONAL PURPOSES ONLY!!!   Name: Tina [name]  DOB: Aug 04, 1965  Age: 51 y.o. Sex: female  CC:  Chief Complaint  Patient presents with  . Follow-up    recheck left ring finger (seen on 06/20/16)    PCP: Juline Patch, MD  HPI: Patient is here for f/u of finger dislocation from 06/20/16.   Patient is doing well. She reports that her splint got wet on Thursday 06/21/16. She used a popsicle stick on her finger on that Friday. She has not had a splint on her finger today. She reports she has been using the finger intermittently. She reports she took 1 ibuprofen on 06/20/16. No other pain medication.   ROS:  Constitutional: Negative for activity change, appetite change, fatigue and unexpected weight change.  HENT: Negative for congestion, dental problem, ear pain, hearing loss, mouth sores, postnasal drip, rhinorrhea, sneezing, sore throat, tinnitus and trouble swallowing.   Eyes: Negative for photophobia, pain, redness and visual disturbance.  Respiratory: Negative for cough, chest tightness and shortness of breath.   Cardiovascular: Negative for chest pain, palpitations and leg swelling.  Gastrointestinal: Negative for abdominal pain, blood in stool, constipation, diarrhea, nausea and vomiting.  Genitourinary: Negative for dysuria, frequency, hematuria and urgency.  Musculoskeletal: Negative for arthralgias, gait problem, myalgias and neck stiffness.  Skin: Negative for rash.  Neurological: Negative for dizziness, speech difficulty, weakness, light-headedness, numbness and headaches.  Hematological: Negative for adenopathy.  Psychiatric/Behavioral: Negative for confusion and sleep disturbance. The patient is not nervous/anxious.    PMH: There are no active problems to display for this patient.   Allergies: No Known Allergies  Medications:  No current outpatient prescriptions on file prior to visit.   No current facility-administered medications on file  prior to visit.     PE:  GS: WDWN female sitting on exam table in NAD.  Vitals: BP 114/74   Pulse 60   Temp 97.8 F (36.6 C) (Oral)   Resp 17   Wt 266 lb 2 oz (120.7 kg)   SpO2 100%   BMI 38.97 kg/m  HEENT: Normocephalic, atruamatic. PEARRL.  Cardiovascular: RRR. No S3 or S4. No murmurs, rubs, or gallops. Pulses 2+ and equal bilateral in the upper and lower extremities. No pitting edema. No varicosities, clubbing, or cyanosis.  Pulm: CTA bilaterally. No expiratory muscle use while breathing.  GI: +BS. NTND. No rigidity or guarding. No rebound tenderness.  MSK: Ecchymosis and edema of the left ring finger and the base and tip of the finger. Strength 5/5 of the left ring finger. Decreased ROM of the left finger due to edema. Capillary refill <2. Sensation intact.  Neuro: CN 2-12 grossly intact.  Psych: A&O x 4. Mood and affect appropriate for situation.  Skin: Warm and dry. No rashes or excoriations on exposed skin.   A&P:  Dislocation of left ring finger, subsequent encounter- improving. Edema and ecchymosis still present. Encouraged to use ice buckets for the finger 20 minutes on 20 minutes off. Move as tolerated. Use stretches and resistive motion to build strength. Buddy tape as needed for support when you're going to use the finger.        Respectfully,  Camillia Herter, PA-S2

## 2016-06-23 NOTE — Progress Notes (Signed)
     Chief Complaint  Patient presents with  . Follow-up    recheck left ring finger (seen on 06/20/16)    History of Present Illness: Patient presents for re-evaluation of LEFT ring finger injury.  She was seen here on 06/20/2016 found to have a dislocation of the LEFT 4th finger DIP sustained in a fall the day before. The joint was reduced and the finger splinted. She returns today reporting that she feels well. The splint got wet the day following her visit here, and she made a new splint using a popsicle stick. She has not used a splint of any kind today. Has used ibuprofen x 1 dose, and has been using her finger intermittently.   No Known Allergies  Prior to Admission medications   Not on File    There are no active problems to display for this patient.    Physical Exam  Constitutional: She is oriented to person, place, and time. She appears well-developed and well-nourished. She is active and cooperative. No distress.  BP 114/74   Pulse 60   Temp 97.8 F (36.6 C) (Oral)   Resp 17   Ht 5' 9.29" (1.76 m)   Wt 266 lb 2 oz (120.7 kg)   SpO2 100%   BMI 38.97 kg/m    Eyes: Conjunctivae are normal.  Pulmonary/Chest: Effort normal.  Musculoskeletal:       Left hand: She exhibits tenderness. She exhibits normal range of motion, normal capillary refill, no deformity and no swelling. Normal sensation noted. Normal strength noted.       Hands: Neurological: She is alert and oriented to person, place, and time.  Psychiatric: She has a normal mood and affect. Her speech is normal and behavior is normal.      ASSESSMENT & PLAN:  Problem List Items Addressed This Visit    None    Visit Diagnoses    Dislocation of left ring finger, subsequent encounter    -  Primary   Ice. NSAIDS. Use splint when she will be doing work that may increase risk of re-injury.       Return if symptoms worsen or fail to improve.   Fernande Bras, PA-C [facility]

## 2017-09-13 IMAGING — DX DG FINGER RING 2+V*L*
3 series · 3 of 3 positions shown · non-contrast
Comparison: 06/20/2016.

CLINICAL DATA: Post reduction of fourth digit proximal
interphalangeal joint, initial encounter.

EXAM:
LEFT RING FINGER 2+V

[finger ap]
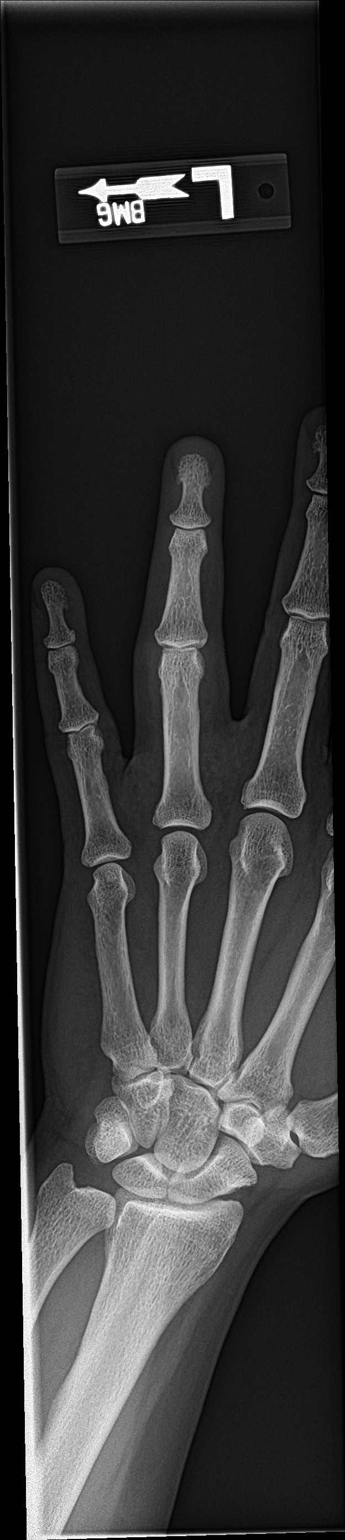

[finger obl]
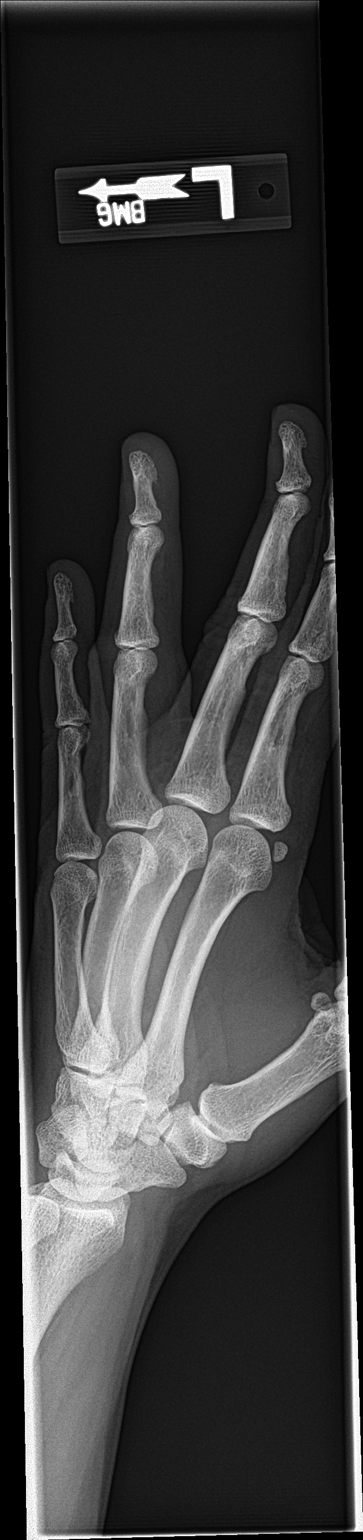

[finger lat]
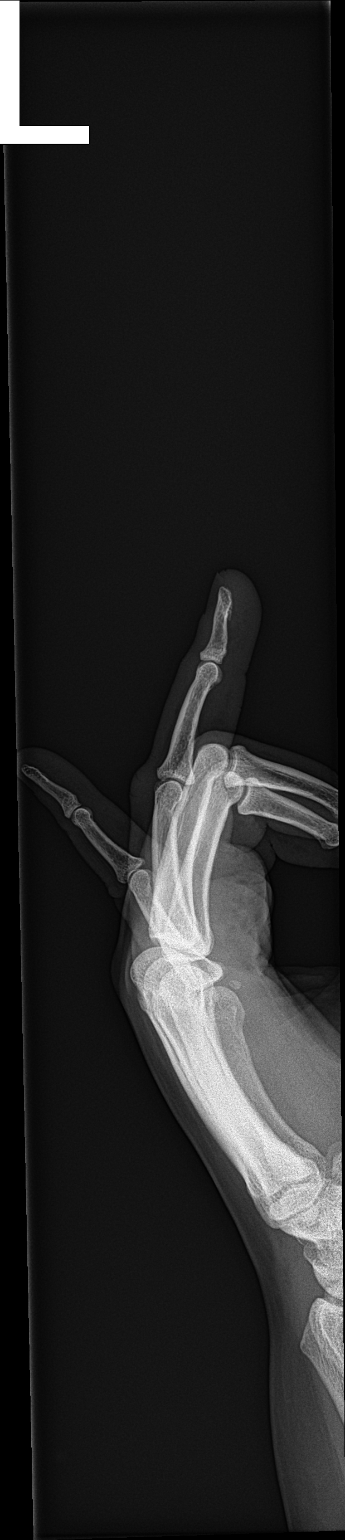

[3 of 3 positions shown; findings below may reference images not displayed]

FINDINGS: Interval restoration of anatomic alignment of the fourth proximal
interphalangeal joint. No associated fracture. Overlying soft tissue
swelling.
IMPRESSION: Interval restoration of anatomic alignment of the fourth proximal
interphalangeal joint. No associated fracture.

## 2019-09-29 ENCOUNTER — Ambulatory Visit: Payer: BC Managed Care – PPO | Attending: Family

## 2019-09-29 DIAGNOSIS — Z23 Encounter for immunization: Secondary | ICD-10-CM

## 2019-09-29 NOTE — Progress Notes (Signed)
   Covid-19 Vaccination Clinic  Name:  MOZELL HABER    MRN: [id number] DOB: 07-09-1965  09/29/2019  Ms. Lorenzetti was observed post Covid-19 immunization for 15 minutes without incident. She was provided with Vaccine Information Sheet and instruction to access the V-Safe system.   Ms. Swoboda was instructed to call 911 with any severe reactions post vaccine: Marland Kitchen Difficulty breathing  . Swelling of face and throat  . A fast heartbeat  . A bad rash all over body  . Dizziness and weakness   Immunizations Administered    Name Date Dose VIS Date Route   Moderna COVID-19 Vaccine 09/29/2019  4:33 PM 0.5 mL 02/2019 Intramuscular   Manufacturer: Moderna   Lot: [id number]   NDC: 13086-578-46

## 2019-10-27 ENCOUNTER — Ambulatory Visit: Payer: BC Managed Care – PPO | Attending: Internal Medicine

## 2019-10-27 DIAGNOSIS — Z23 Encounter for immunization: Secondary | ICD-10-CM

## 2019-10-27 NOTE — Progress Notes (Signed)
   Covid-19 Vaccination Clinic  Name:  Tina [name]    MRN: [id number] DOB: 05/18/1965  10/27/2019  [name] was observed post Covid-19 immunization for 15 minutes without incident. She was provided with Vaccine Information Sheet and instruction to access the V-Safe system.   [name] was instructed to call 911 with any severe reactions post vaccine: Marland Kitchen Difficulty breathing  . Swelling of face and throat  . A fast heartbeat  . A bad rash all over body  . Dizziness and weakness   Immunizations Administered    Name Date Dose VIS Date Route   Moderna COVID-19 Vaccine 10/27/2019 12:21 PM 0.5 mL 02/2019 Intramuscular   Manufacturer: Moderna   Lot: [id number]   NDC: 88110-315-94
